# Patient Record
Sex: Male | Born: 1990 | Race: White | Hispanic: No | Marital: Single | Smoking: Never smoker
Health system: Southern US, Academic
[De-identification: ages and names within clinical notes are randomized; demographics above are authoritative.]

## PROBLEM LIST (undated history)

## (undated) DIAGNOSIS — F419 Anxiety disorder, unspecified: Secondary | ICD-10-CM

## (undated) DIAGNOSIS — I10 Essential (primary) hypertension: Secondary | ICD-10-CM

## (undated) HISTORY — DX: Anxiety disorder, unspecified: F41.9

## (undated) HISTORY — DX: Essential (primary) hypertension: I10

---

## 2012-05-22 HISTORY — PX: TONSILLECTOMY: SUR1361

## 2013-05-22 DIAGNOSIS — I10 Essential (primary) hypertension: Secondary | ICD-10-CM | POA: Insufficient documentation

## 2019-03-22 ENCOUNTER — Other Ambulatory Visit: Payer: Self-pay

## 2019-03-22 ENCOUNTER — Telehealth: Payer: BLUE CROSS/BLUE SHIELD

## 2019-03-22 DIAGNOSIS — L739 Follicular disorder, unspecified: Secondary | ICD-10-CM

## 2019-03-22 MED ORDER — DOXYCYCLINE HYCLATE 100 MG CAPSULE
100.00 mg | ORAL_CAPSULE | Freq: Two times a day (BID) | ORAL | 0 refills | Status: AC
Start: 2019-03-22 — End: 2019-04-01

## 2019-03-22 NOTE — Progress Notes (Signed)
URGENT CARE, [facility]  [place] [address]  [facility]  Video Visit     Name:  Benjamin Ross MRN: [id number]   Date:    03/22/2019 Age:  28 y.o.                                      Patient's location: [place] [address]   Patient/family aware of provider location: Yes  Patient/family consent for video visit: Yes  Interview and observation performed by: Dr. Vergia Alcon     Chief Complaint: Skin Lesions    History of Present Illness:  Benjamin Ross is a 28 y.o. male is presenting to [place] via [place] video with c/o skin lesions onset ~3 weeks ago. Pts lesions are located under his chin within his beard. These lesions start "like pimples" and then "bust and swell". He received a haircut and a beard trim shortly prior to the onset of his symptoms. He has had his beard trimmed in the past with no issue. Pt has used a topical retinoid and a topical antibiotic with no improvement of sxs. He associates surrounding redness, tenderness, and occasional purulent drainage. He denies fever, chills, nausea, vomiting, and gland swelling today in clinic. Pt has no other complaints at this time.      Past Medical History:  He has no past medical history on file.  He has no past surgical history on file.  He does not have a problem list on file.    Allergies:  Allergies not on file    Medications:  .  doxycycline hyclate     Review of Systems:    General: No fever, and no chills   Gastrointestinal: No nausea, and no vomiting   Skin: Skin lesions (starting as pimples, located in beard), surrounding redness, tenderness, and purulent drainage (occasional)   Lymph: No gland swelling     All other review of systems were negative    Observational Exam:    General: Appears well  Pulmonary: No signs of respiratory distress  Skin: Poor quality video images show multiple areas of localized erythema on inferior anterior neck within beard. No specific pustules could be  identified.   Psychiatric: Alert & oriented x 3    Differential Diagnosis: Conatact Dermatitis vs Impetigo     Assessment:   1. Folliculitis      Plan:    Orders Placed This Encounter   . doxycycline hyclate (VIBRAMYCIN) 100 mg Oral Capsule         Prescribed and educated on new medications: Doxycyline   Symptomatic management with OTC and home remedies such as: Warm compresses, and Tylenol/Ibuprofen  Follow-up with PCP PRN.     Plan was discussed and patient/parent/guardian verbalized understanding.  If symptoms are not improving the patient should be seen in person by their PCP or at Urgent Care for further evaluation. If symptoms are worsening or emergent present to Emergency Department for higher level of evaluation and care.    I am scribing for, and in the presence of, Dr. Vergia Alcon, for services provided on 03/22/2019.  Heather K Elfline, [place] 03/22/2019, 16:58    I personally performed the services described in this documentation, as scribed  in my presence, and it is both accurate  and complete.    Axel Filler, MD

## 2019-06-17 ENCOUNTER — Telehealth (INDEPENDENT_AMBULATORY_CARE_PROVIDER_SITE_OTHER): Payer: Self-pay | Admitting: Family Medicine

## 2019-06-17 NOTE — Progress Notes (Signed)
[facility]  History & Physical     Name  MRN: Benjamin Ross  [id number] Date of service: 06/18/2019   Age  DOB: 29 y.o.  12-10-1990 Chief Complaint: Establish Care     HISTORY OF PRESENTING ILLNESS:     29 y.o. male presents to establish care. Transitioning care from [place].    PMH reviewed.  Pt is treated for hypertension and anxiety.  Pt has been on his current medications for a years.  BP has been stable.  Pt reports mood is overall stable.      Pt is in Pharmacy residency.      Pt and I discussed diet and exercise.  Pt plans to go back to the gym soon.  There he does cardio.  Pt reports trying to stay below 2000 calories.      Pt reports having a visit for spots on his face in the past.  They come and go.  They can get sore and itch.  They do not ooze.  This has been happening for 4 months.  Pt is using basic soaps.  Folliculitis discussed.  Discussed this is likely due to him wearing a mask and wearing a beard.  Supportive care discussed at length.       MEDICAL HISTORY:     PAST MEDICAL & SURGICAL HISTORIES:   No past medical history on file. No past surgical history on file.   HOME MEDICATIONS:  Current Outpatient Medications   Medication Sig   . amLODIPine (NORVASC) 5 mg Oral Tablet Take 5 mg by mouth Once a day   . candesartan (ATACAND) 32 mg Oral Tablet Take 32 mg by mouth Once a day   . sertraline (ZOLOFT) 100 mg Oral Tablet Take 100 mg by mouth Once a day      ALLERGIES:  He has No Known Allergies.      FAMILY HISTORY:  His family history is not on file.   SOCIAL HISTORY:  He  reports current alcohol use. He  reports that he has never smoked. He has never used smokeless tobacco. He  reports previous drug use.     REVIEW OF SYSTEMS:   Constitutional: Negative for chills and fever.   HENT: Negative for congestion, ear pain and sore throat.   Eyes: Negative for visual disturbance.   Respiratory: Negative for cough and shortness of breath.   Cardiovascular: Negative for chest pain  and palpitations.   Gastrointestinal: Negative for abdominal pain, constipation and diarrhea.   Genitourinary: Negative for dysuria.   Musculoskeletal: Negative for arthralgias and back pain.   Skin:  Recurring furuncles in beard   Neurological: Negative for dizziness, syncope and headaches.   Hematological: Negative for adenopathy.   Psychiatric/Behavioral: Negative for agitation and behavioral problems.       EXAMINATION:   Vitals:   Vitals:    06/18/19 0814   BP: 128/80   Pulse: 98   Resp: 18   Temp: 36.6 C (97.8 F)   TempSrc: Thermal Scan   SpO2: 98%   Weight: (!) 187 kg (413 lb 2.3 oz)   Height: 1.88 m (6\' 2" )   BMI: 53.16                     Body mass index is 53.04 kg/m.      General: appears in good health, appears stated age, no acute distress and non-toxic   Eyes: PER, EOMI , sclera non-icteric. Cojunctiva clear.   HENT:  Normocephalic atraumatic, External ears and nose normal, moist mucous membranes.   Neck:  neck supple, symmetrical, trachea midline.   Lungs: clear to auscultation bilaterally,  no appreciable wheezes or rhonchi. .  Cardiovascular: heart regular rate and rhythm without murmur.    Abdomen:  bowel sounds normal, soft, non-distended, non-tender,  no CVA tenderness.    Extremities: no cyanosis, clubbing, or edema, distal lower extremity pulses 2+ and equal .    MSK: spine straight, no tenderness over spinous processes, paraspinal muscles, no joint swelling, redness, or warmth.    Skin: Skin warm and dry, .  Small resolving furuncles appreciated in beard, 1 room active furuncle appreciated on left chin  Neurologic: CN II - XII grossly intact , alert and oriented x3, no tremor, sensory: intact to light touch, strength: grossly intact normal tone,  gait: normal.    Lymphatics: no appreciable lymphadenopathy.    Psychiatric: normal affect, behavior, memory, thought content, judgement, and speech.           DATA REVIEWED:   Previous labs, tests, imaging, and notes have been personally reviewed  by me.            Immunizations:                                                                                                                                                             Immunization History   Administered Date(s) Administered   . Covid-19 Vaccine,Pfizer-BioNTech 05/15/2019   . Influenza Vaccine, 6 month-adult 02/21/2018, 03/06/2019          ASSESSMENT and PLAN     Benjamin Ross is a 29 y.o. male      Prophet was seen today for establish care.    Diagnoses and all orders for this visit:    Essential hypertension  -     BASIC METABOLIC PANEL; Future  -     CBC/DIFF; Future  -     THYROID STIMULATING HORMONE WITH FREE T4 REFLEX; Future  -     LIPID PANEL; Future  -     BASIC METABOLIC PANEL  -     CBC/DIFF  -     THYROID STIMULATING HORMONE WITH FREE T4 REFLEX  -     LIPID PANEL  -check labs.  Continue current medication.  Monitor blood pressure.    Folliculitis barbae  -     BASIC METABOLIC PANEL; Future  -     CBC/DIFF; Future  -     THYROID STIMULATING HORMONE WITH FREE T4 REFLEX; Future  -     LIPID PANEL; Future  -     BASIC METABOLIC PANEL  -     CBC/DIFF  -     THYROID STIMULATING HORMONE WITH FREE T4 REFLEX  -  LIPID PANEL  -recommended topical antibiotics and warm compresses as needed.  If symptoms persist or worsen we may consider systemic treatment or referral.  Recommended Selsun Blue shampoo when cleaning beard    GAD (generalized anxiety disorder)  -     BASIC METABOLIC PANEL; Future  -     CBC/DIFF; Future  -     THYROID STIMULATING HORMONE WITH FREE T4 REFLEX; Future  -     LIPID PANEL; Future  -     BASIC METABOLIC PANEL  -     CBC/DIFF  -     THYROID STIMULATING HORMONE WITH FREE T4 REFLEX  -     LIPID PANEL  -continue Zoloft.    Morbid obesity with BMI of 50.0-59.9, adult (CMS HCC)  -diet exercise discussed at length.                       Return in about 6 months (around 12/16/2019).    The patient has been educated and verbalized understanding regarding the services provided  during this visit.    Mervin Hack MD  Family Medicine  Dept of [facility]  Assistant Professor    [facility]  [address], [place]. [place], [place] [address]  [phone]

## 2019-06-17 NOTE — Nursing Note (Signed)
Called patient prior to appointment to prescreen for COVID-19.      Have you had new or worsened shortness of breath in the past 14 days?  No  Have you had a new or worsening cough in the past 14 days?  No  Have you had a fever in the past 14 days?  No  Have you experienced a loss of taste or smell in the past 14 days? No  Have you experienced headache with nausea in the past 14 days? No    Patient or patients guardian/attendant has a Negative Prescreen.      Patient informed of visitor policy at this time.    Informed patient that we are requesting all patients and visitors wear a mask when entering the clinic.     Appointment notes have been updated to reflect screening.    Patient instructed to present to the clinic for scheduled appointment.    Deberah Castle, MA  06/17/2019, 10:21

## 2019-06-18 ENCOUNTER — Ambulatory Visit (INDEPENDENT_AMBULATORY_CARE_PROVIDER_SITE_OTHER): Payer: BLUE CROSS/BLUE SHIELD | Admitting: Family Medicine

## 2019-06-18 ENCOUNTER — Encounter (FREE_STANDING_LABORATORY_FACILITY): Payer: BLUE CROSS/BLUE SHIELD | Admitting: Family Medicine

## 2019-06-18 ENCOUNTER — Encounter (INDEPENDENT_AMBULATORY_CARE_PROVIDER_SITE_OTHER): Payer: Self-pay | Admitting: Family Medicine

## 2019-06-18 ENCOUNTER — Other Ambulatory Visit: Payer: Self-pay

## 2019-06-18 ENCOUNTER — Encounter (FREE_STANDING_LABORATORY_FACILITY)
Admit: 2019-06-18 | Discharge: 2019-06-18 | Disposition: A | Discharge status: Home / Self Care | Payer: BLUE CROSS/BLUE SHIELD | Attending: Family Medicine | Admitting: Family Medicine

## 2019-06-18 VITALS — BP 128/80 | HR 98 | Temp 97.8°F | Resp 18 | Ht 74.0 in | Wt >= 6400 oz

## 2019-06-18 DIAGNOSIS — F411 Generalized anxiety disorder: Secondary | ICD-10-CM | POA: Insufficient documentation

## 2019-06-18 DIAGNOSIS — L738 Other specified follicular disorders: Secondary | ICD-10-CM

## 2019-06-18 DIAGNOSIS — I10 Essential (primary) hypertension: Secondary | ICD-10-CM

## 2019-06-18 DIAGNOSIS — Z6841 Body Mass Index (BMI) 40.0 and over, adult: Secondary | ICD-10-CM | POA: Insufficient documentation

## 2019-06-18 LAB — BASIC METABOLIC PANEL
ANION GAP: 15 mmol/L — ABNORMAL HIGH (ref 4–13)
BUN/CREA RATIO: 17 (ref 6–22)
BUN: 17 mg/dL (ref 8–25)
CALCIUM: 9.4 mg/dL (ref 8.5–10.2)
CHLORIDE: 104 mmol/L (ref 96–111)
CO2 TOTAL: 21 mmol/L — ABNORMAL LOW (ref 22–32)
CREATININE: 0.98 mg/dL (ref 0.62–1.27)
ESTIMATED GFR: >60 mL/min/{1.73_m2} (ref >60)
GLUCOSE: 141 mg/dL — ABNORMAL HIGH (ref 65–139)
POTASSIUM: 4.3 mmol/L (ref 3.5–5.1)
SODIUM: 140 mmol/L (ref 136–145)

## 2019-06-18 LAB — CBC WITH DIFF
BASOPHIL #: <0.1 10*3/uL (ref <=0.20)
BASOPHIL %: 1 %
EOSINOPHIL #: 0.38 10*3/uL (ref <=0.50)
EOSINOPHIL %: 5 %
HCT: 43.3 % (ref 38.9–52.0)
HGB: 14.5 g/dL (ref 13.4–17.5)
IMMATURE GRANULOCYTE #: <0.1 10*3/uL (ref <0.10)
IMMATURE GRANULOCYTE %: 0 % (ref 0–1)
LYMPHOCYTE #: 2.97 10*3/uL (ref 1.00–4.80)
LYMPHOCYTE %: 37 %
MCH: 27.8 pg (ref 26.0–32.0)
MCHC: 33.5 g/dL (ref 31.0–35.5)
MCV: 83 fL (ref 78.0–100.0)
MONOCYTE #: 0.54 10*3/uL (ref 0.20–1.10)
MONOCYTE %: 7 %
MPV: 11.4 fL (ref 8.7–12.5)
NEUTROPHIL #: 3.97 10*3/uL (ref 1.50–7.70)
NEUTROPHIL %: 50 %
PLATELETS: 246 10*3/uL (ref 150–400)
RBC: 5.22 10*6/uL (ref 4.50–6.10)
RDW-CV: 13.2 % (ref 11.5–15.5)
WBC: 8 10*3/uL (ref 3.7–11.0)

## 2019-06-18 LAB — LIPID PANEL
CHOL/HDL RATIO: 4.9
CHOLESTEROL: 195 mg/dL (ref <200)
HDL CHOL: 40 mg/dL (ref >=39)
LDL CALC: 129 mg/dL — ABNORMAL HIGH (ref <=100)
NON-HDL: 155 mg/dL (ref <=190)
TRIGLYCERIDES: 131 mg/dL (ref <=150)
VLDL CALC: 26 mg/dL (ref <30)

## 2019-06-18 LAB — THYROID STIMULATING HORMONE WITH FREE T4 REFLEX: TSH: 1.158 u[IU]/mL (ref 0.350–5.000)

## 2019-06-18 NOTE — Nurses Notes (Signed)
Department of Community Practice     Venipuncture performed in office on right arm antecubital vein, dry pressure dressing was applied to site and patient tolerated it well.  Specimen was centrifuged, aliquoted as needed and specimen was labeled and packaged for transport.    Phillis Knack, Ambulatory Care Assistant  06/18/2019, 08:56

## 2019-06-18 NOTE — Patient Instructions (Signed)
Folliculitis  Folliculitis is an infection of a hair follicle. A hair follicle is the little pocket where a hair grows out of the skin. Bacteria normally live on the skin. But sometimes bacteria can get trapped in a follicle and cause infection. This causes a bumpy rash. The area over the follicles is red and raised. It may itch or be painful. The bumps may have fluid (pus) inside. The pus may leak and then form crusts. Sores can spread to other areas of the body. Once it goes away, folliculitis can come back at any time. Severe cases may cause lasting (permanent) hair loss and scarring.   Folliculitis can happen anywhere on the body where hair grows. It can be caused by rubbing from tight clothing. Ingrown hairs can cause it. Soaking in a hot tub or swimming pool that has bacteria in the water can cause it. It may also occur if a hair follicle is blocked by a bandage.   Sores often go away in a few days with no treatment. In some cases, medicine may be given. A small piece of skin or pus may be taken to find the type of bacteria causing the infection.   Home care  The healthcare provider may prescribe an antibiotic cream or ointment.Antibiotics taken by mouth (oral) may also be prescribed. Or you may be told to use an over-the-counter antibiotic cream. Follow all instructions when using any of these medicines.   General care   Apply warm, moist compresses to the sores for 20 minutes up to 3 times a day. You can make a compress by soaking a cloth in warm water. Squeeze out excess water.   Don't cut, poke, or squeeze the sores. This can be painful and spread infection.   Don't scratch the affected area. Scratching can delay healing.   Don't shave the areas affected by folliculitis.   If the sores leak fluid, cover the area with a nonstick gauze bandage. Use as little tape as possible. Carefully get rid of all soiled bandages.   Dress in loose cotton clothing.   Change sheets and blankets if they are soiled  by pus. Wash all clothes, towels, sheets, and cloth diapers in soap and hot water. Don't share clothes, towels, or sheets with other family members.   Don't soak the sores in bath water. This can spread infection. Instead keep the area clean by gently washing sores with soap and warm water.   Wash your hands or use antibacterial gels often to prevent spreading the bacteria.    Follow-up care  Follow up with your healthcare provider, or as advised.  When to get medical advice  Call your healthcare provider right awayif any of these occur:   Fever of 100.4F (38C) or higher, or as directed by your provider   Rash spreads   Rash does not get better with treatment   Redness or swelling that gets worse   Rash becomes more painful   Foul-smelling fluid leaking from the skin   Rash improves, but then comes back  [employer] last reviewed this educational content on 12/20/2017   2000-2020 [employer]. All rights reserved. This information is not intended as a substitute for professional medical care. Always follow your healthcare professional's instructions.

## 2019-06-18 NOTE — Nursing Note (Signed)
06/18/19 0800   Depression Screen   Little interest or pleasure in doing things. 0   Feeling down, depressed, or hopeless 0   PHQ 2 Total 0   Phillis Knack, Ambulatory Care Assistant  06/18/2019, 08:14

## 2019-07-25 ENCOUNTER — Other Ambulatory Visit: Payer: Self-pay

## 2019-07-25 ENCOUNTER — Encounter (FREE_STANDING_LABORATORY_FACILITY)
Admit: 2019-07-25 | Discharge: 2019-07-25 | Disposition: A | Discharge status: Home / Self Care | Payer: BLUE CROSS/BLUE SHIELD | Attending: Emergency Medicine | Admitting: Emergency Medicine

## 2019-07-25 ENCOUNTER — Encounter (FREE_STANDING_LABORATORY_FACILITY): Payer: BLUE CROSS/BLUE SHIELD | Admitting: Emergency Medicine

## 2019-07-25 ENCOUNTER — Ambulatory Visit (INDEPENDENT_AMBULATORY_CARE_PROVIDER_SITE_OTHER): Payer: BLUE CROSS/BLUE SHIELD

## 2019-07-25 VITALS — BP 138/74 | HR 88 | Temp 96.6°F | Resp 18 | Ht 74.0 in | Wt >= 6400 oz

## 2019-07-25 DIAGNOSIS — J029 Acute pharyngitis, unspecified: Secondary | ICD-10-CM | POA: Insufficient documentation

## 2019-07-25 MED ORDER — METHYLPREDNISOLONE 4 MG TABLETS IN A DOSE PACK
ORAL_TABLET | ORAL | 0 refills | Status: DC
Start: 2019-07-25 — End: 2019-11-17

## 2019-07-25 NOTE — Progress Notes (Signed)
Chief Complaint            Sore Throat sore throat since mid-week, losing voice         Pt presents to Urgent Care with c/o sore throat onset several days ago. Pt sts he has had both doses of the COVID-19 vaccine. 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 Pt sts he does not have his tonsils. Pt associates post nasal drainage, slight hoarseness in voice, and sneezing (occasionally). Pt denies fever, chills, cough, vomiting, and diarrhea.      I reviewed and confirmed the patient's past medical history taken by the nurse or medical assistant with the addition of the following:    Past Medical History:    Past Medical History:   Diagnosis Date   . Anxiety    . HTN (hypertension)      Past Surgical History:    Past Surgical History:   Procedure Laterality Date   . TONSILLECTOMY  2014     Allergies:  No Known Allergies  Medications:    Current Outpatient Medications   Medication Sig   . amLODIPine (NORVASC) 5 mg Oral Tablet Take 5 mg by mouth Once a day   . candesartan (ATACAND) 32 mg Oral Tablet Take 32 mg by mouth Once a day   . Methylprednisolone (MEDROL DOSEPACK) 4 mg Oral Tablets, Dose Pack Take as instructed.   . sertraline (ZOLOFT) 100 mg Oral Tablet Take 100 mg by mouth Once a day     Social History:    Social History     Tobacco Use   . Smoking status: Never Smoker   . Smokeless tobacco: Never Used   Substance Use Topics   . Alcohol use: Yes     Comment: occasional   . Drug use: Not Currently     Family History: No significant family history.  Family Medical History:     None         Review of Systems:    General: No fever and no chills  ENT: Sore throat, post nasal drainage, slight hoarseness, and sneezing  Pulmonary: No cough  Cardiovascular:   Gastrointestinal: No vomiting and no diarrhea    All other review of systems were negative    Physical Exam:  Vital signs:   Vitals:    07/25/19 1235   BP: 138/74   Pulse:  88   Resp: 18   Temp: 35.9 C (96.6 F)   TempSrc: Tympanic   SpO2: 95%   Weight: (!) 187 kg (412 lb 4.2 oz)   Height: 1.88 m (6\' 2" )   BMI: 53.04         Body mass index is 52.93 kg/m. Facility age limit for growth percentiles is 20 years.  No LMP for male patient.    General:  Well appearing and No acute distress  Eyes:  Normal lids/lashes, normal conjunctiva  ENT:  No tonsils, cobblestoning in throat, no hoarseness or strider  Pulmonary:  clear to auscultation bilaterally, no wheezes, no rales and no rhonchi  Cardiovascular:  regular rate/rhythm, normal S1/S2, no murmur/rub/gallop  Skin:  warm/dry and no rash  Psychiatric:  Appropriate affect and behavior and Normal speech  Neurologic:   Alert and oriented x 3  Hem/Lymph:  No cervical lymphadenopathy    Data Reviewed:      Point-of-care testing:     Rapid Strep: Negative  and Labs: Throat culture    Course: Condition at discharge: Good     Differential Diagnosis: Allergies vs URI vs Laryngitis    Assessment:   1. Sore throat      Plan:    Orders Placed This Encounter   . Throat Culture   . Rapid Strep   . Methylprednisolone (MEDROL DOSEPACK) 4 mg Oral Tablets, Dose Pack   Rapid strep returned negative.   Throat culture obtained. Will follow up with results.    Patient was advised to use OTC antihistamines if symptoms are not improving. Patient recommended to use Medrol pack if symptoms do not improve.     Go to Emergency Department immediately for further work up if any concerning symptoms.  Plan was discussed and patient verbalized understanding.  If symptoms are worsening or not improving the patient should return to the Urgent Care for further evaluation.    I am scribing for, and in the presence of, Dr. Ollen Gross for services provided on 07/25/2019.  Eulogio Bear, SCRIBE   [place], [place] 07/25/2019, 14:25     I personally performed the services described in this documentation, as scribed  in my presence, and it is both accurate  and  complete.    Roney Jaffe, MD

## 2019-07-27 LAB — THROAT CULTURE, BETA HEMOLYTIC STREPTOCOCCUS: THROAT CULTURE: NORMAL

## 2019-09-04 ENCOUNTER — Encounter (INDEPENDENT_AMBULATORY_CARE_PROVIDER_SITE_OTHER): Payer: Self-pay | Admitting: Family Medicine

## 2019-09-04 DIAGNOSIS — L738 Other specified follicular disorders: Secondary | ICD-10-CM

## 2019-09-08 NOTE — Telephone Encounter (Signed)
Please inform patient I will place referral.  Please have him return to clinic for an acute visit if symptoms persist or referral takes  a long time to get scheduled.  Room

## 2019-09-08 NOTE — Telephone Encounter (Signed)
-----   Message from Maria Martino, RN sent at 09/05/2019  7:40 AM EDT -----  Regarding: FW: Referral Question  Contact: [phone]    ----- Message -----  From: Ayrton Tkach  Sent: 09/04/2019   6:54 PM EDT  To: , #  Subject: Referral Question                                Hi Dr. Adael Culbreath,    Would it be possible for you to refer me to dermatology at [facility]? I am still having issues with my face breaking out and wanted to see if they had any additional recommendations.     With Regards,      Josh

## 2019-09-10 NOTE — Telephone Encounter (Signed)
-----   Message from Isabell Jarvis, RN sent at 09/05/2019  7:40 AM EDT -----  Regarding: FW: Referral Question  Contact: [phone]    ----- Message -----  From: Jetty Peeks  Sent: 09/04/2019   6:54 PM EDT  To: , #  Subject: Referral Question                                Hi Dr. Ty Hilts,    Would it be possible for you to refer me to dermatology at [facility]? I am still having issues with my face breaking out and wanted to see if they had any additional recommendations.     With Regards,      Benjamin Ross

## 2019-09-10 NOTE — Telephone Encounter (Signed)
-----   Message from Maria Martino, RN sent at 09/05/2019  7:40 AM EDT -----  Regarding: FW: Referral Question  Contact: [phone]    ----- Message -----  From: Timtohy Wiederhold  Sent: 09/04/2019   6:54 PM EDT  To: , #  Subject: Referral Question                                Hi Dr. Larrie Fraizer,    Would it be possible for you to refer me to dermatology at [facility]? I am still having issues with my face breaking out and wanted to see if they had any additional recommendations.     With Regards,      Josh

## 2019-09-12 ENCOUNTER — Encounter (HOSPITAL_BASED_OUTPATIENT_CLINIC_OR_DEPARTMENT_OTHER): Payer: Self-pay | Admitting: Family

## 2019-09-12 ENCOUNTER — Other Ambulatory Visit: Payer: Self-pay

## 2019-09-15 ENCOUNTER — Encounter (HOSPITAL_BASED_OUTPATIENT_CLINIC_OR_DEPARTMENT_OTHER): Payer: Self-pay | Admitting: Family

## 2019-09-15 ENCOUNTER — Other Ambulatory Visit: Payer: Self-pay

## 2019-09-15 ENCOUNTER — Ambulatory Visit: Payer: BLUE CROSS/BLUE SHIELD | Attending: DERMATOLOGY | Admitting: Family

## 2019-09-15 ENCOUNTER — Ambulatory Visit (HOSPITAL_BASED_OUTPATIENT_CLINIC_OR_DEPARTMENT_OTHER): Payer: Self-pay | Admitting: Family

## 2019-09-15 VITALS — BP 134/78 | HR 78 | Temp 97.3°F | Wt >= 6400 oz

## 2019-09-15 DIAGNOSIS — L738 Other specified follicular disorders: Secondary | ICD-10-CM

## 2019-09-15 DIAGNOSIS — Z792 Long term (current) use of antibiotics: Secondary | ICD-10-CM | POA: Insufficient documentation

## 2019-09-15 MED ORDER — DOXYCYCLINE HYCLATE 100 MG CAPSULE
100.00 mg | ORAL_CAPSULE | Freq: Two times a day (BID) | ORAL | 0 refills | Status: AC
Start: 2019-09-15 — End: 2019-10-15

## 2019-09-15 NOTE — Patient Instructions (Addendum)
Warm soaks 2-3 times daily for 20 minutes     Leave on face for 1-3 minutes

## 2019-09-15 NOTE — Progress Notes (Signed)
Dermatology Clinic, [facility]  [place] [address]  [phone]    Date:   09/15/2019  Name: Benjamin Ross  Age: 29 y.o.    Chief complaint: Skin Lesions      HPI    Benjamin Ross is a 29 y.o. White male presenting as a referral today with Skin Lesions. Patient states in Sept 2020 he noticed inflamed hair follicles in his beard. States he was given 2 weeks of Doxycycline in Oct. 2020 and noticed slight improvement. States he was also given Ketoconazole shampoo and use OTC Differin gel without improvement. Here to discuss treatment options. He his also concerned about lesions on his nose. States they have been present for the last 6 months. Denies bleeding itching or rapid change. He otherwise denies any new, changing, bleeding, or rapidly growing lesions.  No other skin-related complaints. No personal history of skin cancer, but father has a history of BCC.     Review of Systems   Constitutional: Negative for chills and fever.   Skin: Negative for itching and rash.       Current Medications  Outpatient Encounter Medications as of 09/15/2019   Medication Sig Dispense Refill    amLODIPine (NORVASC) 5 mg Oral Tablet Take 5 mg by mouth Once a day      candesartan (ATACAND) 32 mg Oral Tablet Take 32 mg by mouth Once a day      doxycycline hyclate (VIBRAMYCIN) 100 mg Oral Capsule Take 1 Capsule (100 mg total) by mouth Twice daily for 30 days 60 Capsule 0    Methylprednisolone (MEDROL DOSEPACK) 4 mg Oral Tablets, Dose Pack Take as instructed. 21 Tablet 0    sertraline (ZOLOFT) 100 mg Oral Tablet Take 100 mg by mouth Once a day       No facility-administered encounter medications on file as of 09/15/2019.        No Known Allergies     Past Medical History:   Diagnosis Date    Anxiety     HTN (hypertension)             Patient Active Problem List    Diagnosis Date Noted    GAD (generalized anxiety disorder) 06/18/2019    Morbid obesity with BMI of 50.0-59.9, adult (CMS  [place]) 06/18/2019    HTN (hypertension) 05/22/2013         Physical Exam  Vitals:    Temp:  [36.3 C (97.3 F)] 36.3 C (97.3 F) (04/26 1436)  HR:  [78] 78 (04/26 1436)  BP: (134)/(78) 134/78 (04/26 1436)   Vitals:    09/15/19 1436   BP: 134/78   Pulse: 78   Temp: 36.3 C (97.3 F)   Weight: (!) 187 kg (411 lb 2.5 oz)        Physical Exam  Constitutional:       General: He is not in acute distress.     Appearance: Normal appearance.   HENT:      Head:     Skin:     General: Skin is warm and dry.      Coloration: Skin is not pale.   Neurological:      Mental Status: He is alert and oriented to person, place, and time. Mental status is at baseline.      Coordination: Coordination normal.   Psychiatric:         Mood and Affect: Mood normal.        General skin  exam was performed including head, neck, anterior/posterior trunk, and bilateral upper extremities and revealed no areas of concern other than those documented.  Declined lower body exam.     Assessment and Plan  1. Folliculitis barbae (See #1 & #2 on head diagram) - new  - Start doxycycline 100 mg, take 1 tablet twice a day for 1 month.  Take this medication with food to prevent stomach upset (try to avoid multivitamins and dairy if possible). Doxycyline may cause significant sun sensitivity. It is highly recommended to use a daily moisturizer with SPF 30 or higher for acne prone skin or non-comedogenic.   - Start OTC benzoyl peroxide wash, between 2% and 5% strength, for face, chest and back. Recommended use once daily in the shower. Potential side effects, including irritation and bleaching of fabric/clothing, discussed.      2. Sebaceous Hyperplasia (See #3 on head diagram) - new  - Pt educated on benign nature of lesion         The patient was educated on the importance of avoiding excessive sun exposure and wearing sunscreen daily SPF 30 or higher.  Advised patient to re-apply sunscreen every 2-3 hours.  Advised the patient to avoid going to and using  tanning beds.  Advised to check skin routinely for any changes, especially any new moles or changes in existing moles.     Return in about 6 weeks (around 10/27/2019).    This is a shared visit with Dr. Theora Master, APRN,FNP-BC    I personally saw and evaluated the patient. See mid-level's note for additional details. My findings/participation are pink sub q papules, possible resolving folliculitis, will treat with doxy.    Miles Costain, MD

## 2019-10-27 ENCOUNTER — Encounter (HOSPITAL_BASED_OUTPATIENT_CLINIC_OR_DEPARTMENT_OTHER): Payer: Self-pay | Admitting: Family

## 2019-11-11 ENCOUNTER — Encounter (HOSPITAL_BASED_OUTPATIENT_CLINIC_OR_DEPARTMENT_OTHER): Payer: Self-pay | Admitting: Family

## 2019-11-12 ENCOUNTER — Encounter (INDEPENDENT_AMBULATORY_CARE_PROVIDER_SITE_OTHER): Payer: Self-pay | Admitting: Family Medicine

## 2019-11-17 ENCOUNTER — Encounter (HOSPITAL_BASED_OUTPATIENT_CLINIC_OR_DEPARTMENT_OTHER): Payer: Self-pay | Admitting: Family

## 2019-11-17 ENCOUNTER — Ambulatory Visit: Payer: BLUE CROSS/BLUE SHIELD | Attending: Family | Admitting: Family

## 2019-11-17 ENCOUNTER — Telehealth: Payer: BLUE CROSS/BLUE SHIELD | Admitting: Family Medicine

## 2019-11-17 ENCOUNTER — Other Ambulatory Visit: Payer: Self-pay

## 2019-11-17 VITALS — BP 131/69 | HR 83 | Temp 96.8°F | Wt >= 6400 oz

## 2019-11-17 DIAGNOSIS — Z6841 Body Mass Index (BMI) 40.0 and over, adult: Secondary | ICD-10-CM

## 2019-11-17 DIAGNOSIS — F411 Generalized anxiety disorder: Secondary | ICD-10-CM

## 2019-11-17 DIAGNOSIS — L738 Other specified follicular disorders: Secondary | ICD-10-CM

## 2019-11-17 DIAGNOSIS — I10 Essential (primary) hypertension: Secondary | ICD-10-CM

## 2019-11-17 MED ORDER — SERTRALINE 100 MG TABLET
100.00 mg | ORAL_TABLET | Freq: Every day | ORAL | 3 refills | Status: DC
Start: 2019-11-17 — End: 2020-02-25

## 2019-11-17 MED ORDER — AMLODIPINE 5 MG TABLET
5.0000 mg | ORAL_TABLET | Freq: Every day | ORAL | 3 refills | Status: DC
Start: 2019-11-17 — End: 2020-02-25

## 2019-11-17 MED ORDER — CANDESARTAN 32 MG TABLET
32.00 mg | ORAL_TABLET | Freq: Every day | ORAL | 3 refills | Status: DC
Start: 2019-11-17 — End: 2020-02-25

## 2019-11-17 MED ORDER — TAZAROTENE 0.1 % TOPICAL CREAM
TOPICAL_CREAM | Freq: Every evening | CUTANEOUS | 0 refills | Status: AC
Start: 2019-11-17 — End: 2019-12-17

## 2019-11-17 NOTE — Progress Notes (Signed)
FAMILY MEDICINE, [facility]  [place] [address]  [facility]  Video Visit     Name:  Benjamin Ross MRN: [id number]   Date:    11/17/2019 Age:  28 y.o.                                      Patient's location: [place] [address]   Patient/family aware of provider location: Yes  Patient/family consent for video visit: Yes  Interview and observation performed by: Erik Obey, MD    Chief Complaint: No chief complaint on file.    History of Present Illness:  Benjamin Ross is a 29 y.o. male  On for follow up.       Pt is working with dermatology.  Notes reviewed.  Patient is using a retinoid.  Patient has been through 2 cycles of doxycycline.  Lesions in beard and on Neck have greatly improved.    Pt is going to move to [place] and work at [employer].  He moves August.      Pt reports BP has been stable.      Pt reports mood symptoms are also stable.      Patient reports that he has not gained or lost significant amount of weight.    Medications reviewed with patient.        Past Medical History:  He has a past medical history of Anxiety and HTN (hypertension).  He has a past surgical history that includes Tonsillectomy (2014).  He does not have any pertinent problems on file.    Medications:  .  amLODIPine  .  candesartan  .  sertraline  .  Tazarotene     Review of Systems:  Review of Systems   Constitutional: Negative for chills and fever.   HENT: Negative for congestion, ear pain and sore throat.   Eyes: Negative for visual disturbance.   Respiratory: Negative for cough and shortness of breath.   Cardiovascular: Negative for chest pain and palpitations.   Gastrointestinal: Negative for abdominal pain, constipation and diarrhea.   Genitourinary: Negative for dysuria.   Musculoskeletal: Negative for arthralgias and back pain.   Skin:  Chin rash improving.   Neurological: Negative for dizziness, syncope and headaches.   Hematological: Negative for  adenopathy.   Psychiatric/Behavioral: Negative for agitation and behavioral problems.       Observational Exam:   Vitals: There were no vitals filed for this visit.       There is no height or weight on file to calculate BMI.      General: appears in good health, appears stated age, no acute distress and non-toxic  Eyes:  No sclera non-icteric. Cojunctiva clear  HENT:  Normocephalic atraumatic,   Neck:  Appears symmetrical  Lungs:  normal effort  Abdomen:   non-distended,   Extremities: no cyanosis appreciated  MSK:  Appears to have normal range of motion  Skin:  Chin rash improving  Neurologic: alert and oriented x3,   Psychiatric: normal affect, behavior, memory, thought content, judgement, and speech.      Data Reviewed:       Assessment/Plan:    ICD-10-CM    1. Essential hypertension  I10 amLODIPine (NORVASC) 5 mg Oral Tablet     candesartan (ATACAND) 32 mg Oral Tablet   2. GAD (generalized anxiety disorder)  F41.1 sertraline (ZOLOFT) 100 mg Oral Tablet  3. Morbid obesity with BMI of 50.0-59.9, adult (CMS HCC)  E66.01     Z68.43      Orders Placed This Encounter   . amLODIPine (NORVASC) 5 mg Oral Tablet   . candesartan (ATACAND) 32 mg Oral Tablet   . sertraline (ZOLOFT) 100 mg Oral Tablet     Follow Up:  patient will follow-up as needed.  He will be moving in the near future.  Continue current medications.  Refills sent in today.  Diet exercise and weight loss encouraged.    Tana Conch, MD

## 2019-11-17 NOTE — Progress Notes (Signed)
Dermatology Clinic, [facility]  [address]  [place] [address]  [phone]    Date:   11/17/2019  Name: Benjamin Ross  Age: 28 y.o.    Chief complaint: Follow Up and Skin Lesions      HPI    Benjamin Ross is a 29 y.o. White male presenting as a referral today with Follow Up and Skin Lesions. Patient states in Sept 2020 he noticed inflamed hair follicles in his beard. States he was given 2 weeks of Doxycycline in Oct. 2020 and noticed slight improvement. States he was also given Ketoconazole shampoo and use OTC Differin gel without improvement. Patient was seen in clinic in 09/15/2019 and given another course of Doxycycline. Noticed great improvement.  No other skin-related complaints.     Review of Systems   Constitutional: Negative for chills and fever.   Skin: Negative for itching and rash.       Current Medications  Outpatient Encounter Medications as of 11/17/2019   Medication Sig Dispense Refill   . amLODIPine (NORVASC) 5 mg Oral Tablet Take 5 mg by mouth Once a day     . candesartan (ATACAND) 32 mg Oral Tablet Take 32 mg by mouth Once a day     . sertraline (ZOLOFT) 100 mg Oral Tablet Take 100 mg by mouth Once a day     . Tazarotene 0.1 % Cream Apply topically Every night for 30 days use thin film 30 g 0   . [DISCONTINUED] Methylprednisolone (MEDROL DOSEPACK) 4 mg Oral Tablets, Dose Pack Take as instructed. 21 Tablet 0     No facility-administered encounter medications on file as of 11/17/2019.        No Known Allergies     Past Medical History:   Diagnosis Date   . Anxiety    . HTN (hypertension)             Patient Active Problem List    Diagnosis Date Noted   . GAD (generalized anxiety disorder) 06/18/2019   . Morbid obesity with BMI of 50.0-59.9, adult (CMS HCC) 06/18/2019   . HTN (hypertension) 05/22/2013         Physical Exam  Vitals:    Temp:  [36 C (96.8 F)] 36 C (96.8 F) (06/28 0958)  HR:  [83] 83 (06/28 0958)  BP: (131)/(69) 131/69 (06/28 0958)   Vitals:     11/17/19 0958   BP: 131/69   Site: Right   Patient Position: Sitting   Cuff Size: Adult Large   Pulse: 83   Temp: 36 C (96.8 F)   TempSrc: Thermal Scan   Weight: (!) 186 kg (410 lb 7.9 oz)        Physical Exam  Constitutional:       General: He is not in acute distress.     Appearance: Normal appearance.   HENT:      Head:     Skin:     General: Skin is warm and dry.      Coloration: Skin is not pale.   Neurological:      Mental Status: He is alert and oriented to person, place, and time. Mental status is at baseline.      Coordination: Coordination normal.   Psychiatric:         Mood and Affect: Mood normal.          Declined skin exam.     Assessment and Plan    1. Folliculitis barbae (See #  1  on head diagram) - improving  - Start Tazarotene 1% cream nightly. Prescription given.   - Continue OTC benzoyl peroxide wash, between 2% and 5% strength, for face, chest and back. Recommended use once daily in the shower. Potential side effects, including irritation and bleaching of fabric/clothing, discussed.      The patient was educated on the importance of avoiding excessive sun exposure and wearing sunscreen daily SPF 30 or higher.  Advised patient to re-apply sunscreen every 2-3 hours.  Advised the patient to avoid going to and using tanning beds.  Advised to check skin routinely for any changes, especially any new moles or changes in existing moles.    This visit was rendered independently with a supervising physician on site.     [employer], APRN,FNP-BC  11/17/2019, 13:04

## 2019-11-18 ENCOUNTER — Encounter (INDEPENDENT_AMBULATORY_CARE_PROVIDER_SITE_OTHER): Payer: Self-pay | Admitting: Family Medicine

## 2019-11-18 ENCOUNTER — Encounter (HOSPITAL_BASED_OUTPATIENT_CLINIC_OR_DEPARTMENT_OTHER): Payer: BLUE CROSS/BLUE SHIELD | Admitting: Family

## 2019-12-17 ENCOUNTER — Encounter (INDEPENDENT_AMBULATORY_CARE_PROVIDER_SITE_OTHER): Payer: Self-pay | Admitting: Family Medicine

## 2019-12-17 ENCOUNTER — Ambulatory Visit: Payer: Self-pay | Attending: Family Medicine

## 2019-12-17 DIAGNOSIS — J069 Acute upper respiratory infection, unspecified: Secondary | ICD-10-CM | POA: Insufficient documentation

## 2019-12-17 DIAGNOSIS — Z20822 Contact with and (suspected) exposure to covid-19: Secondary | ICD-10-CM | POA: Insufficient documentation

## 2019-12-17 LAB — COVID-19 ~~LOC~~ MOLECULAR LAB TESTING: SARS-CoV-2: NOT DETECTED

## 2019-12-17 NOTE — Telephone Encounter (Signed)
Called patient to inform him.  Deberah Castle, MA  12/17/2019, 09:54

## 2019-12-17 NOTE — Telephone Encounter (Signed)
-----   Message from Faythe Casa, [place] sent at 12/17/2019  9:25 AM EDT -----  Regarding: FW: Non-Urgent Medical Question  Contact: [phone]    ----- Message -----  From: Jetty Peeks  Sent: 12/17/2019   9:15 AM EDT  To: , #  Subject: Non-Urgent Medical Question                      Hi Dr. Ty Hilts,     I started having some cough, post nasal drip, dizziness, and fatigue yesterday. This could easily be allergies, I've also been having runny nose and watery eyes for the past month. I move this weekend and my parents are coming to help me with the move. Do you think I should get a Covid test? If so could you order me one? I was vaccinated back in December.     Thanks,    [employer]

## 2019-12-17 NOTE — Telephone Encounter (Signed)
Please inform patient I will order him a coronavirus test.  Please give him the information for this.

## 2019-12-17 NOTE — Telephone Encounter (Signed)
Regarding: Non-Urgent Medical Question  ----- Message from Tana Conch, MD sent at 12/17/2019  9:29 AM EDT -----       ----- Message from Jetty Peeks to Tana Conch, MD sent at 12/17/2019  9:15 AM -----   Hi Dr. Ty Hilts,     I started having some cough, post nasal drip, dizziness, and fatigue yesterday. This could easily be allergies, I've also been having runny nose and watery eyes for the past month. I move this weekend and my parents are coming to help me with the move. Do you think I should get a Covid test? If so could you order me one? I was vaccinated back in December.     Thanks,    Benjamin Ross

## 2020-02-25 ENCOUNTER — Other Ambulatory Visit (INDEPENDENT_AMBULATORY_CARE_PROVIDER_SITE_OTHER): Payer: Self-pay | Admitting: Family Medicine

## 2020-02-25 DIAGNOSIS — F411 Generalized anxiety disorder: Secondary | ICD-10-CM

## 2020-02-25 DIAGNOSIS — I10 Essential (primary) hypertension: Secondary | ICD-10-CM

## 2020-02-25 MED ORDER — AMLODIPINE 5 MG TABLET
5.00 mg | ORAL_TABLET | Freq: Every day | ORAL | 0 refills | Status: DC
Start: 2020-02-25 — End: 2020-03-08

## 2020-02-25 MED ORDER — CANDESARTAN 32 MG TABLET
32.00 mg | ORAL_TABLET | Freq: Every day | ORAL | 0 refills | Status: DC
Start: 2020-02-25 — End: 2020-03-08

## 2020-02-25 MED ORDER — SERTRALINE 100 MG TABLET
100.00 mg | ORAL_TABLET | Freq: Every day | ORAL | 0 refills | Status: DC
Start: 2020-02-25 — End: 2020-03-08

## 2020-02-25 NOTE — Telephone Encounter (Signed)
Last scheduled appointment with you was Visit date not found.  Currently scheduled future appointment is Visit date not found.    Patient has been seen within the last year: Yes.    Confirmed preferred pharmacy for this refill encounter is   Preferred Pharmacy     [facility] [phone] - [address] [place] - [address]    [address] [place] [address]    Phone: [phone] Fax: [phone]    Hours: Open 24 hours    [facility] - [place], [place] - [address]    [address] [place] [address]    Phone: [phone] Fax: [phone]    Hours: Not open 24 hours    [id number] - [place], [place] - [address]    [address] [place] [address]    Phone: [phone] Fax: [phone]    Hours: Not open 24 hours      .     Deberah Castle, MA  02/25/2020, 09:34

## 2020-03-08 ENCOUNTER — Other Ambulatory Visit (INDEPENDENT_AMBULATORY_CARE_PROVIDER_SITE_OTHER): Payer: Self-pay | Admitting: Family Medicine

## 2020-03-08 DIAGNOSIS — F411 Generalized anxiety disorder: Secondary | ICD-10-CM

## 2020-03-08 DIAGNOSIS — I10 Essential (primary) hypertension: Secondary | ICD-10-CM

## 2020-03-08 MED ORDER — SERTRALINE 100 MG TABLET
100.00 mg | ORAL_TABLET | Freq: Every day | ORAL | 0 refills | Status: AC
Start: 2020-03-08 — End: ?

## 2020-03-08 MED ORDER — AMLODIPINE 5 MG TABLET
5.00 mg | ORAL_TABLET | Freq: Every day | ORAL | 0 refills | Status: DC
Start: 2020-03-08 — End: 2021-01-25

## 2020-03-08 MED ORDER — CANDESARTAN 32 MG TABLET
32.00 mg | ORAL_TABLET | Freq: Every day | ORAL | 0 refills | Status: DC
Start: 2020-03-08 — End: 2021-01-25

## 2020-03-08 NOTE — Telephone Encounter (Signed)
Last scheduled appointment with you was Visit date not found.  Currently scheduled future appointment is Visit date not found.    Patient has been seen within the last year: Yes.    Confirmed preferred pharmacy for this refill encounter is   Preferred Pharmacy     [facility] [phone] - [address] [place] - [address]    [address] [place] [address]    Phone: [phone] Fax: [phone]    Hours: Open 24 hours    [facility] - [place], [place] - [address]    [address] [place] [address]    Phone: [phone] Fax: [phone]    Hours: Not open 24 hours    [id number] - [place], [place] - [address]    [address] [place] [address]    Phone: [phone] Fax: [phone]    Hours: Not open 24 hours      .     Deberah Castle, MA  03/08/2020, 08:26

## 2020-10-25 ENCOUNTER — Other Ambulatory Visit (INDEPENDENT_AMBULATORY_CARE_PROVIDER_SITE_OTHER): Payer: Self-pay | Admitting: Family Medicine

## 2020-10-25 DIAGNOSIS — F411 Generalized anxiety disorder: Secondary | ICD-10-CM

## 2021-01-24 ENCOUNTER — Other Ambulatory Visit (INDEPENDENT_AMBULATORY_CARE_PROVIDER_SITE_OTHER): Payer: Self-pay | Admitting: Family Medicine

## 2021-01-24 DIAGNOSIS — I10 Essential (primary) hypertension: Secondary | ICD-10-CM

## 2021-01-25 NOTE — Telephone Encounter (Signed)
Last scheduled appointment with you was Visit date not found, Visit date not found.  Currently scheduled future appointment is Visit date not found, Visit date not found.    Patient has been seen within the last year: Yes.    Confirmed preferred pharmacy for this refill encounter is   Preferred Pharmacy     [facility] [phone] - [address], [place] - [address]    [address] [place] [address]    Phone: [phone] Fax: [phone]    Hours: Open 24 hours    [facility]    [address] [place] [address]    Phone: [phone] Fax: [phone]    Hours: Darl Householder, Sunday [address] - [place], [place] - [address]    [address] [place] [address]    Phone: [phone] Fax: [phone]    Hours: Not open 24 hours    [facility] - [place], [place] - [address]    [address] [place] [id number]    Phone: [phone] Fax: [phone]    Hours: Not open 24 hours      .     Deberah Castle, MA  01/25/2021, 07:50

## 2021-03-09 ENCOUNTER — Telehealth (INDEPENDENT_AMBULATORY_CARE_PROVIDER_SITE_OTHER): Payer: Self-pay | Admitting: Family Medicine

## 2021-03-09 NOTE — Telephone Encounter (Signed)
Left message to schedule wellness Benjamin Ross  03/09/2021, 11:44
